# Patient Record
Sex: Female | Born: 1990 | Race: White | Hispanic: No | Marital: Single | Smoking: Former smoker
Health system: Southern US, Community
[De-identification: ages and names within clinical notes are randomized; demographics above are authoritative.]

## PROBLEM LIST (undated history)

## (undated) DIAGNOSIS — G40909 Epilepsy, unspecified, not intractable, without status epilepticus: Secondary | ICD-10-CM

## (undated) HISTORY — PX: NO PAST SURGERIES: SHX2092

---

## 2016-11-27 ENCOUNTER — Emergency Department
Admission: EM | Admit: 2016-11-27 | Discharge: 2016-11-27 | Disposition: A | Discharge status: Home / Self Care | Payer: Medicaid - Out of State | Attending: Emergency Medicine | Admitting: Emergency Medicine

## 2016-11-27 ENCOUNTER — Encounter: Payer: Self-pay | Admitting: Emergency Medicine

## 2016-11-27 DIAGNOSIS — F17210 Nicotine dependence, cigarettes, uncomplicated: Secondary | ICD-10-CM | POA: Insufficient documentation

## 2016-11-27 DIAGNOSIS — J0100 Acute maxillary sinusitis, unspecified: Secondary | ICD-10-CM | POA: Diagnosis not present

## 2016-11-27 DIAGNOSIS — J069 Acute upper respiratory infection, unspecified: Secondary | ICD-10-CM | POA: Diagnosis not present

## 2016-11-27 DIAGNOSIS — R0981 Nasal congestion: Secondary | ICD-10-CM | POA: Diagnosis present

## 2016-11-27 MED ORDER — FLUTICASONE PROPIONATE 50 MCG/ACT NA SUSP: 2.0000 | Freq: Every day | NASAL | 0 refills | Status: AC

## 2016-11-27 MED ORDER — PREDNISONE 10 MG PO TABS: ORAL_TABLET | ORAL | 0 refills | Status: DC

## 2016-11-27 MED ORDER — AMOXICILLIN-POT CLAVULANATE 875-125 MG PO TABS: 1.0000 | ORAL_TABLET | Freq: Two times a day (BID) | ORAL | 0 refills | Status: AC

## 2016-11-27 NOTE — ED Notes (Signed)
Pt alert and oriented X4, active, cooperative, pt in NAD. RR even and unlabored, color WNL.  Pt informed to return if any life threatening symptoms occur.   

## 2016-11-27 NOTE — Discharge Instructions (Signed)
[facility] if any continued problems. Begin taking Augmentin 875 twice a day for 10 days, Flonase nasal spray as directed and prednisone 3 tablets once a day for the next 5 days. You may also take Tylenol if needed for headache. Increase fluids. Decrease or discontinue smoking.

## 2016-11-27 NOTE — ED Notes (Signed)
Patient states she has had this "cold" for the last two weeks and it has been getting worse. None productive cough most of the time, unable to sleep at night d/t the congestion.

## 2016-11-27 NOTE — ED Provider Notes (Signed)
[facility] Emergency Department Provider Note  ____________________________________________   First MD Initiated Contact with Patient 11/27/16 1236     (approximate)  I have reviewed the triage vital signs and the nursing notes.   HISTORY  Chief Complaint Nasal Congestion   HPI Jaime Ibarra is a 26 y.o. female is here with complaint of cough and congestion for the last 2 weeks. Patient states that she thought she was getting better but now has developed bilateral ear pain and a productive cough. Patient is been taking over-the-counter medication with minimal relief. Currently she rates her pain as 5/10.  History reviewed. No pertinent past medical history.  There are no active problems to display for this patient.   History reviewed. No pertinent surgical history.  Prior to Admission medications   Medication Sig Start Date End Date Taking? Authorizing Provider  amoxicillin-clavulanate (AUGMENTIN) 875-125 MG tablet Take 1 tablet by mouth 2 (two) times daily. 11/27/16 12/04/16  Tommi RumpsSummers, Andromeda Poppen L, PA-C  fluticasone (FLONASE) 50 MCG/ACT nasal spray Place 2 sprays into both nostrils daily. 11/27/16 11/27/17  Tommi RumpsSummers, Onaje Warne L, PA-C  predniSONE (DELTASONE) 10 MG tablet Take 3 tablets once a day for 5 days 11/27/16   Tommi RumpsSummers, Lateka Rady L, PA-C    Allergies Patient has no known allergies.  No family history on file.  Social History Social History  Substance Use Topics  . Smoking status: Current Every Day Smoker    Packs/day: 0.25    Types: Cigarettes  . Smokeless tobacco: Never Used  . Alcohol use No    Review of Systems  Constitutional: No fever/chills Eyes: No visual changes. ENT: No sore throat. Positive bilateral ear pain. Positive maxillary sinus pain. Cardiovascular: Denies chest pain. Respiratory: Denies shortness of breath. Gastrointestinal: No abdominal pain.  No nausea, no vomiting.   Musculoskeletal: Negative for back pain. Skin:  Negative for rash. Neurological: Negative for headaches, focal weakness or numbness. ____________________________________________   PHYSICAL EXAM:  VITAL SIGNS: ED Triage Vitals  Enc Vitals Group     BP 11/27/16 1140 120/69     Pulse Rate 11/27/16 1140 (!) 111     Resp 11/27/16 1140 18     Temp 11/27/16 1140 97.6 F (36.4 C)     Temp Source 11/27/16 1140 Oral     SpO2 11/27/16 1140 97 %     Weight 11/27/16 1137 120 lb (54.4 kg)     Height 11/27/16 1137 5' (1.524 m)     Head Circumference --      Peak Flow --      Pain Score 11/27/16 1137 5     Pain Loc --      Pain Edu? --      Excl. in GC? --    Constitutional: Alert and oriented. Well appearing and in no acute distress. Eyes: Conjunctivae are normal.  Head: Atraumatic. Nose: mild congestion/rhinnorhea.  EACs are clear bilaterally. TMs are dull with minimal effusion present. Maxillary sinuses tender to percussion bilaterally. Mouth/Throat: Mucous membranes are moist.  Oropharynx non-erythematous. Neck: No stridor.   Hematological/Lymphatic/Immunilogical: No cervical lymphadenopathy. Cardiovascular: Normal rate, regular rhythm. Grossly normal heart sounds.  Good peripheral circulation. Respiratory: Normal respiratory effort.  No retractions. Lungs CTAB. Gastrointestinal: Soft and nontender.  Musculoskeletal: [place] and lower extremities without any difficulty. Neurologic:  Normal speech and language. No gross focal neurologic deficits are appreciated. No gait instability. Skin:  Skin is warm, dry and intact. No rash noted. Psychiatric: Mood and affect are normal. Speech and behavior  are normal.  ____________________________________________   LABS (all labs ordered are listed, but only abnormal results are displayed)  Labs Reviewed - No data to display   PROCEDURES  Procedure(s) performed: None  Procedures  Critical Care performed: No  ____________________________________________   INITIAL IMPRESSION /  ASSESSMENT AND PLAN / ED COURSE   Patient was discharged on  Augmentin 875 twice a day for 10 days along with prednisone and Flonase nasal spray. Patient is to follow-up with [facility]  clinic if any continued problems. She is also encouraged to discontinue smoking. ____________________________________________   FINAL CLINICAL IMPRESSION(S) / ED DIAGNOSES  Final diagnoses:  Acute maxillary sinusitis, recurrence not specified  Upper respiratory tract infection, unspecified type      NEW MEDICATIONS STARTED DURING THIS VISIT:  Discharge Medication List as of 11/27/2016 12:56 PM    START taking these medications   Details  amoxicillin-clavulanate (AUGMENTIN) 875-125 MG tablet Take 1 tablet by mouth 2 (two) times daily., Starting Thu 11/27/2016, Until Thu 12/04/2016, Print    fluticasone (FLONASE) 50 MCG/ACT nasal spray Place 2 sprays into both nostrils daily., Starting Thu 11/27/2016, Until Fri 11/27/2017, Print    predniSONE (DELTASONE) 10 MG tablet Take 3 tablets once a day for 5 days, Print         Note:  This document was prepared using Dragon voice recognition software and may include unintentional dictation errors.    Tommi Rumps, PA-C 11/27/16 1600    Jeanmarie Plant, MD 11/29/16 [phone]

## 2016-11-27 NOTE — ED Triage Notes (Signed)
Patient presents to the ED with congestion x 2 weeks with bilateral ear pain and cough.  Patient is in no obvious distress at this time.  Patient states, "I thought it was getting better, but the past two days, I've felt worse."

## 2017-04-13 ENCOUNTER — Emergency Department: Payer: Medicaid - Out of State

## 2017-04-13 ENCOUNTER — Other Ambulatory Visit: Payer: Self-pay

## 2017-04-13 ENCOUNTER — Encounter: Payer: Self-pay | Admitting: Emergency Medicine

## 2017-04-13 ENCOUNTER — Emergency Department
Admission: EM | Admit: 2017-04-13 | Discharge: 2017-04-13 | Disposition: A | Discharge status: Home / Self Care | Payer: Medicaid - Out of State | Attending: Emergency Medicine | Admitting: Emergency Medicine

## 2017-04-13 DIAGNOSIS — J40 Bronchitis, not specified as acute or chronic: Secondary | ICD-10-CM | POA: Insufficient documentation

## 2017-04-13 DIAGNOSIS — F17210 Nicotine dependence, cigarettes, uncomplicated: Secondary | ICD-10-CM | POA: Insufficient documentation

## 2017-04-13 MED ORDER — ALBUTEROL SULFATE HFA 108 (90 BASE) MCG/ACT IN AERS: 2.0000 | INHALATION_SPRAY | Freq: Four times a day (QID) | RESPIRATORY_TRACT | 2 refills | Status: DC | PRN

## 2017-04-13 MED ORDER — CETIRIZINE HCL 10 MG PO CAPS: 1.0000 | ORAL_CAPSULE | Freq: Every day | ORAL | 1 refills | Status: DC

## 2017-04-13 NOTE — ED Notes (Signed)
See triage note  Presents with "bad" cough  States she coughs so hard that she can't breath  Also has had some occasional wheezing  No fever

## 2017-04-13 NOTE — ED Triage Notes (Signed)
States every couple days gets a severe coughing spell and this has been happening since January.

## 2017-04-13 NOTE — ED Provider Notes (Signed)
[facility] Emergency Department Provider Note  ____________________________________________  Time seen: Approximately 5:16 PM  I have reviewed the triage vital signs and the nursing notes.   HISTORY  Chief Complaint Cough    HPI Jaime Ibarra is a 27 y.o. female presents to the emergency department with nonproductive cough that occurs intermittently every couple of days.  Patient reports that when she experiences a coughing spell, her throat becomes pruritic and she becomes short of breath.  Patient smokes approximately 6 cigarettes/day.  Cough is not productive for purulent sputum production and is not worse at night.  Patient only has one coughing episode at a time.  She denies chest tightness and chest pain.  She smokes cigarettes and recreational marijuana.  No alleviating measures have been attempted.   History reviewed. No pertinent past medical history.  There are no active problems to display for this patient.   History reviewed. No pertinent surgical history.  Prior to Admission medications   Medication Sig Start Date End Date Taking? Authorizing Provider  albuterol (PROVENTIL HFA;VENTOLIN HFA) 108 (90 Base) MCG/ACT inhaler Inhale 2 puffs into the lungs every 6 (six) hours as needed for wheezing or shortness of breath. 04/13/17   Orvil Feil, PA-C  Cetirizine HCl (ZYRTEC ALLERGY) 10 MG CAPS Take 1 capsule (10 mg total) by mouth daily. 04/13/17   Orvil Feil, PA-C  fluticasone (FLONASE) 50 MCG/ACT nasal spray Place 2 sprays into both nostrils daily. 11/27/16 11/27/17  Tommi Rumps, PA-C  predniSONE (DELTASONE) 10 MG tablet Take 3 tablets once a day for 5 days 11/27/16   Tommi Rumps, PA-C    Allergies Patient has no known allergies.  No family history on file.  Social History Social History   Tobacco Use  . Smoking status: Current Every Day Smoker    Packs/day: 0.25    Types: Cigarettes  . Smokeless tobacco: Never Used   Substance Use Topics  . Alcohol use: No  . Drug use: Not on file     Review of Systems  Constitutional: No fever/chills Eyes: No visual changes. No discharge ENT: No upper respiratory complaints. Cardiovascular: no chest pain. Respiratory: Patient has nonproductive cough and intermittent shortness of breath. Gastrointestinal: No abdominal pain.  No nausea, no vomiting.  No diarrhea.  No constipation. Musculoskeletal: Negative for musculoskeletal pain. Skin: Negative for rash, abrasions, lacerations, ecchymosis. Neurological: Negative for headaches, focal weakness or numbness.   ____________________________________________   PHYSICAL EXAM:  VITAL SIGNS: ED Triage Vitals [04/13/17 1624]  Enc Vitals Group     BP 126/82     Pulse Rate 97     Resp 18     Temp 98.6 F (37 C)     Temp Source Oral     SpO2 97 %     Weight 124 lb (56.2 kg)     Height 5' (1.524 m)     Head Circumference      Peak Flow      Pain Score      Pain Loc      Pain Edu?      Excl. in GC?      Constitutional: Alert and oriented. Well appearing and in no acute distress. Eyes: Conjunctivae are normal. PERRL. EOMI. Head: Atraumatic. ENT:      Ears: TMs are pearly.      Nose: No congestion/rhinnorhea.      Mouth/Throat: Mucous membranes are moist.  Patient has residual left tonsil  space. Hematological/Lymphatic/Immunilogical: No cervical lymphadenopathy.  Cardiovascular: Normal rate, regular rhythm. Normal S1 and S2.  Good peripheral circulation. Respiratory: Normal respiratory effort without tachypnea or retractions. Lungs CTAB. Good air entry to the bases with no decreased or absent breath sounds. Musculoskeletal: Full range of motion to all extremities. No gross deformities appreciated. Neurologic:  Normal speech and language. No gross focal neurologic deficits are appreciated.  Skin:  Skin is warm, dry and intact. No rash noted. Psychiatric: Mood and affect are normal. Speech and behavior are  normal. Patient exhibits appropriate insight and judgement.   ____________________________________________   LABS (all labs ordered are listed, but only abnormal results are displayed)  Labs Reviewed - No data to display ____________________________________________  EKG   ____________________________________________  RADIOLOGY Geraldo PitterI, Mariadel Mruk M Dvon Jiles, personally viewed and evaluated these images (plain radiographs) as part of my medical decision making, as well as reviewing the written report by the radiologist.  Dg Chest 2 View  Result Date: 04/13/2017 CLINICAL DATA:  Cough and shortness of Breath EXAM: CHEST - 2 VIEW COMPARISON:  None. FINDINGS: The heart size and mediastinal contours are within normal limits. Both lungs are clear. The visualized skeletal structures are unremarkable. IMPRESSION: No active cardiopulmonary disease. Electronically Signed   By: Alcide CleverMark  Lukens M.D.   On: 04/13/2017 16:57    ____________________________________________    PROCEDURES  Procedure(s) performed:    Procedures    Medications - No data to display   ____________________________________________   INITIAL IMPRESSION / ASSESSMENT AND PLAN / ED COURSE  Pertinent labs & imaging results that were available during my care of the patient were reviewed by me and considered in my medical decision making (see chart for details).  Review of the [place] CSRS was performed in accordance of the [facility] prior to dispensing any controlled drugs.     Assessment and plan Bronchitis Patient presents to the emergency department with episodes of coughing spells that are associated with cigarette smoking.  Differential diagnosis included community-acquired pneumonia, smoking induced bronchospasm and acute bronchitis.  Patient was treated empirically with Zyrtec and given an albuterol inhaler.  Patient education regarding smoking cessation was emphasized during 3 aspects of this emergency department encounter.   Patient was advised to follow-up with primary care as needed.  All patient questions were answered.     ____________________________________________  FINAL CLINICAL IMPRESSION(S) / ED DIAGNOSES  Final diagnoses:  Bronchitis      NEW MEDICATIONS STARTED DURING THIS VISIT:  ED Discharge Orders        Ordered    Cetirizine HCl (ZYRTEC ALLERGY) 10 MG CAPS  Daily     04/13/17 1711    albuterol (PROVENTIL HFA;VENTOLIN HFA) 108 (90 Base) MCG/ACT inhaler  Every 6 hours PRN     04/13/17 1711          This chart was dictated using voice recognition software/Dragon. Despite best efforts to proofread, errors can occur which can change the meaning. Any change was purely unintentional.    Orvil FeilWoods, Theta Leaf M, PA-C 04/13/17 1720    Merrily Brittleifenbark, Neil, MD 04/13/17 1744

## 2018-08-28 ENCOUNTER — Observation Stay
Admission: EM | Admit: 2018-08-28 | Discharge: 2018-08-28 | Disposition: A | Discharge status: Home / Self Care | Payer: BC Managed Care – PPO | Attending: Obstetrics & Gynecology | Admitting: Obstetrics & Gynecology

## 2018-08-28 ENCOUNTER — Other Ambulatory Visit: Payer: Self-pay

## 2018-08-28 ENCOUNTER — Encounter: Payer: Self-pay | Admitting: *Deleted

## 2018-08-28 DIAGNOSIS — Z3A24 24 weeks gestation of pregnancy: Secondary | ICD-10-CM | POA: Insufficient documentation

## 2018-08-28 DIAGNOSIS — O26892 Other specified pregnancy related conditions, second trimester: Principal | ICD-10-CM | POA: Insufficient documentation

## 2018-08-28 DIAGNOSIS — Z79899 Other long term (current) drug therapy: Secondary | ICD-10-CM | POA: Diagnosis not present

## 2018-08-28 DIAGNOSIS — M545 Low back pain: Secondary | ICD-10-CM

## 2018-08-28 LAB — URINALYSIS, ROUTINE W REFLEX MICROSCOPIC
Bilirubin Urine: NEGATIVE
Glucose, UA: NEGATIVE mg/dL
Hgb urine dipstick: NEGATIVE
Ketones, ur: NEGATIVE mg/dL
Leukocytes,Ua: NEGATIVE
Nitrite: NEGATIVE
Protein, ur: NEGATIVE mg/dL
Specific Gravity, Urine: 1.013 (ref 1.005–1.030)
pH: 6 (ref 5.0–8.0)

## 2018-08-28 NOTE — Discharge Instructions (Signed)

## 2018-08-28 NOTE — Discharge Summary (Signed)
  See FPN 

## 2018-08-28 NOTE — Progress Notes (Signed)
[facility] [place] [id number] [place] [address] Phone: [phone] Fax: [phone]  August 28, 2018  Patient: Jaime Ibarra  Date of Birth: [birth year]  Date of Visit: 08/28/2018    To Whom It May Concern:  Ahley Bulls was seen and treated in our Labor and [facility] on 08/28/2018. Keiasha Diep  may return to work on 08/29/2018.  Sincerely,  Barnett Applebaum, MD [facility]

## 2018-08-28 NOTE — OB Triage Note (Signed)
Presents with complaint of back pain that "is pretty much all over my back... it comes and goes. Denies any bleeding or leaking of fluid. States urinary frequency, denies burning with urination. States she took 2 tylenol this morning at 8am and they did not help.  Pt receives her prenatal care at [facility].

## 2018-08-28 NOTE — Final Progress Note (Signed)
Physician Final Progress Note  Patient ID: Jaime Ibarra MRN: [id number] DOB/AGE: 03-16-1990 28 y.o.  Admit date: 08/28/2018 Admitting provider: Nadara Mustardobert P Matricia Begnaud, MD Discharge date: 08/28/2018  Admission Diagnoses: Low back pain pregnancy 24 weeks pregnancy  Discharge Diagnoses:   same  Consults: None  Significant Findings/ Diagnostic Studies:  Obstetrics Admission History & Physical   No chief complaint on file.   HPI:  28 y.o. G1P0 @ 4582w0d (12/18/2018, by Patient Reported). Admitted on 08/28/2018:   Patient Active Problem List   Diagnosis Date Noted  . Indication for care in labor or delivery 08/28/2018    Presents for 1 day h/o low back pain that radiates to bilateral groin that is moderate and intermitent and not associated with other findings; tried heat and Tylenol w min relief..   Prenatal care at: at another place. Pregnancy complicated by none.  ROS: A review of systems was performed and negative, except as stated in the above HPI. PMHx: No past medical history on file. PSHx: No past surgical history on file. Medications:  Medications Prior to Admission  Medication Sig Dispense Refill Last Dose  . albuterol (PROVENTIL HFA;VENTOLIN HFA) 108 (90 Base) MCG/ACT inhaler Inhale 2 puffs into the lungs every 6 (six) hours as needed for wheezing or shortness of breath. 1 Inhaler 2 Past Week at Unknown time  . Prenatal Vit-Fe Fumarate-FA (PRENATAL MULTIVITAMIN) TABS tablet Take 1 tablet by mouth.   08/27/2018 at Unknown time  . Cetirizine HCl (ZYRTEC ALLERGY) 10 MG CAPS Take 1 capsule (10 mg total) by mouth daily. (Patient not taking: Reported on 08/28/2018) 30 capsule 1 Not Taking at Unknown time  . fluticasone (FLONASE) 50 MCG/ACT nasal spray Place 2 sprays into both nostrils daily. 16 g 0   . predniSONE (DELTASONE) 10 MG tablet Take 3 tablets once a day for 5 days (Patient not taking: Reported on 08/28/2018) 15 tablet 0 Completed Course at Unknown time   Allergies: has No  Known Allergies. OBHx:  OB History  Gravida Para Term Preterm AB Living  1            SAB TAB Ectopic Multiple Live Births               # Outcome Date GA Lbr Len/2nd Weight Sex Delivery Anes PTL Lv  1 Current            NWG:NFAOZHYQ/MVHQIONGEXBMFHx:Negative/unremarkable except as detailed in HPI.Marland Kitchen.  No family history of birth defects. Soc Hx: Alcohol: none and Recreational drug use: none  Objective:   Vitals:   08/28/18 1238  Resp: 18  Temp: 98.4 F (36.9 C)   Constitutional: Well nourished, well developed female in no acute distress.  HEENT: normal Skin: Warm and dry.  Cardiovascular:Regular rate and rhythm.   Extremity: trace to 1+ bilateral pedal edema Respiratory: Clear to auscultation bilateral. Normal respiratory effort Abdomen: gravid, ND, FHT present, NT, without guarding, without rebound tenderness on exam Back: no CVAT Neuro: DTRs 2+, Cranial nerves grossly intact Psych: Alert and Oriented x3. No memory deficits. Normal mood and affect.  MS: normal gait, normal bilateral lower extremity ROM/strength/stability.  Procedures: FHT reassuring for 24 weeks Toco- no ctxs UA neg Results for orders placed or performed during the hospital encounter of 08/28/18  Urinalysis, Routine w reflex microscopic  Result Value Ref Range   Color, Urine YELLOW (A) YELLOW   APPearance HAZY (A) CLEAR   Specific Gravity, Urine 1.013 1.005 - 1.030   pH 6.0 5.0 - 8.0   Glucose,  UA NEGATIVE NEGATIVE mg/dL   Hgb urine dipstick NEGATIVE NEGATIVE   Bilirubin Urine NEGATIVE NEGATIVE   Ketones, ur NEGATIVE NEGATIVE mg/dL   Protein, ur NEGATIVE NEGATIVE mg/dL   Nitrite NEGATIVE NEGATIVE   Leukocytes,Ua NEGATIVE NEGATIVE    Discharge Condition: good  Disposition: Discharge disposition: 01-Home or Self Care       Diet: Regular diet  Discharge Activity: Activity as tolerated  Discharge Instructions    Call MD for:   Complete by: As directed    Worsening contractions or pain; leakage of fluid;  bleeding.   Diet general   Complete by: As directed    Increase activity slowly   Complete by: As directed      Allergies as of 08/28/2018   No Known Allergies     Medication List    TAKE these medications   albuterol 108 (90 Base) MCG/ACT inhaler Commonly known as: VENTOLIN HFA Inhale 2 puffs into the lungs every 6 (six) hours as needed for wheezing or shortness of breath.   Cetirizine HCl 10 MG Caps Commonly known as: ZyrTEC Allergy Take 1 capsule (10 mg total) by mouth daily.   fluticasone 50 MCG/ACT nasal spray Commonly known as: Flonase Place 2 sprays into both nostrils daily.   predniSONE 10 MG tablet Commonly known as: DELTASONE Take 3 tablets once a day for 5 days   prenatal multivitamin Tabs tablet Take 1 tablet by mouth.        Total time spent taking care of this patient: 15 minutes  Signed: Hoyt Koch 08/28/2018, 1:53 PM

## 2018-09-14 ENCOUNTER — Other Ambulatory Visit: Payer: Self-pay

## 2018-09-14 ENCOUNTER — Ambulatory Visit
Admission: EM | Admit: 2018-09-14 | Discharge: 2018-09-14 | Disposition: A | Discharge status: Home / Self Care | Payer: BC Managed Care – PPO | Attending: Family Medicine | Admitting: Family Medicine

## 2018-09-14 DIAGNOSIS — N760 Acute vaginitis: Secondary | ICD-10-CM

## 2018-09-14 DIAGNOSIS — B9689 Other specified bacterial agents as the cause of diseases classified elsewhere: Secondary | ICD-10-CM | POA: Diagnosis not present

## 2018-09-14 LAB — WET PREP, GENITAL
Sperm: NONE SEEN
Trich, Wet Prep: NONE SEEN
Yeast Wet Prep HPF POC: NONE SEEN

## 2018-09-14 MED ORDER — METRONIDAZOLE 0.75 % VA GEL: 1.0000 | Freq: Every day | VAGINAL | 0 refills | Status: AC

## 2018-09-14 NOTE — ED Provider Notes (Signed)
[facility]    CSN: [id number] Arrival date & time: 09/14/18  1630   History   Chief Complaint Chief Complaint  Patient presents with  . Vaginal Discharge   HPI  28 year old female presents with vaginal discharge. She is currently [redacted] weeks pregnant.  Patient reports that she has recently developed an increase in vaginal discharge from her baseline.  She has noticed a mild odor.  No itching.  No vaginal bleeding.  She reports good fetal movement.  She is otherwise feeling well.  She is concerned that she may have bacterial vaginosis.  No medications or interventions tried.  No other reported symptoms.  No other complaints.  History reviewed as below.  PMH: Hx of BV  Patient Active Problem List   Diagnosis Date Noted  . Indication for care in labor or delivery 08/28/2018   Past Surgical History:  Procedure Laterality Date  . NO PAST SURGERIES      OB History    Gravida  1   Para      Term      Preterm      AB      Living        SAB      TAB      Ectopic      Multiple      Live Births               Home Medications    Prior to Admission medications   Medication Sig Start Date End Date Taking? Authorizing Provider  albuterol (PROVENTIL HFA;VENTOLIN HFA) 108 (90 Base) MCG/ACT inhaler Inhale 2 puffs into the lungs every 6 (six) hours as needed for wheezing or shortness of breath. 04/13/17  Yes Orvil FeilWoods, Jaclyn M, PA-C  Prenatal Vit-Fe Fumarate-FA (PRENATAL MULTIVITAMIN) TABS tablet Take 1 tablet by mouth.   Yes [provider]  fluticasone (FLONASE) 50 MCG/ACT nasal spray Place 2 sprays into both nostrils daily. 11/27/16 11/27/17  Tommi RumpsSummers, Rhonda L, PA-C  metroNIDAZOLE (METROGEL VAGINAL) 0.75 % vaginal gel Place 1 Applicatorful vaginally at bedtime for 5 days. 09/14/18 09/19/18  Tommie Samsook, Zymier Rodgers G, DO  Cetirizine HCl (ZYRTEC ALLERGY) 10 MG CAPS Take 1 capsule (10 mg total) by mouth daily. Patient not taking: Reported on 08/28/2018 04/13/17  09/14/18  Orvil FeilWoods, Jaclyn M, PA-C   Social History Social History   Tobacco Use  . Smoking status: Former Smoker    Packs/day: 0.25    Types: Cigarettes  . Smokeless tobacco: Never Used  Substance Use Topics  . Alcohol use: No  . Drug use: Never     Allergies   Patient has no known allergies.   Review of Systems Review of Systems  Constitutional: Negative.   Genitourinary: Positive for vaginal discharge.   Physical Exam Triage Vital Signs ED Triage Vitals  Enc Vitals Group     BP 09/14/18 1644 (!) 110/57     Pulse Rate 09/14/18 1644 85     Resp 09/14/18 1644 18     Temp 09/14/18 1644 98.1 F (36.7 C)     Temp Source 09/14/18 1644 Oral     SpO2 09/14/18 1644 100 %     Weight 09/14/18 1640 143 lb 4.8 oz (65 kg)     Height 09/14/18 1640 5' (1.524 m)     Head Circumference --      Peak Flow --      Pain Score 09/14/18 1640 0     Pain Loc --  Pain Edu? --      Excl. in [place]? --    Updated Vital Signs BP (!) 110/57 (BP Location: Left Arm)   Pulse 85   Temp 98.1 F (36.7 C) (Oral)   Resp 18   Ht 5' (1.524 m)   Wt 65 kg   SpO2 100%   BMI 27.99 kg/m   Visual Acuity Right Eye Distance:   Left Eye Distance:   Bilateral Distance:    Right Eye Near:   Left Eye Near:    Bilateral Near:     Physical Exam Vitals signs and nursing note reviewed.  Constitutional:      General: She is not in acute distress.    Appearance: Normal appearance.  HENT:     Head: Normocephalic and atraumatic.  Eyes:     General:        Right eye: No discharge.        Left eye: No discharge.     Conjunctiva/sclera: Conjunctivae normal.  Cardiovascular:     Rate and Rhythm: Normal rate and regular rhythm.  Pulmonary:     Effort: Pulmonary effort is normal.     Breath sounds: Normal breath sounds.  Neurological:     Mental Status: She is alert.  Psychiatric:        Mood and Affect: Mood normal.        Behavior: Behavior normal.    UC Treatments / Results  Labs (all labs  ordered are listed, but only abnormal results are displayed) Labs Reviewed  WET PREP, GENITAL - Abnormal; Notable for the following components:      Result Value   Clue Cells Wet Prep HPF POC PRESENT (*)    WBC, Wet Prep HPF POC FEW (*)    All other components within normal limits    EKG   Radiology No results found.  Procedures Procedures (including critical care time)  Medications Ordered in UC Medications - No data to display  Initial Impression / Assessment and Plan / UC Course  I have reviewed the triage vital signs and the nursing notes.  Pertinent labs & imaging results that were available during my care of the patient were reviewed by me and considered in my medical decision making (see chart for details).    28 year old female presents with vaginal discharge.  Wet prep with clue cells.  Treating with Metrogel.  Final Clinical Impressions(s) / UC Diagnoses   Final diagnoses:  Bacterial vaginosis   Discharge Instructions   None    ED Prescriptions    Medication Sig Dispense Auth. Provider   metroNIDAZOLE (METROGEL VAGINAL) 0.75 % vaginal gel Place 1 Applicatorful vaginally at bedtime for 5 days. 70 g Coral Spikes, DO     Controlled Substance Prescriptions [place] Controlled Substance Registry consulted? Not Applicable   Coral Spikes, DO 09/14/18 1717

## 2018-09-14 NOTE — ED Triage Notes (Signed)
Patient complains of increased vaginal discharge with odor. Denies any blood.

## 2019-08-17 ENCOUNTER — Ambulatory Visit
Admission: EM | Admit: 2019-08-17 | Discharge: 2019-08-17 | Disposition: A | Discharge status: Home / Self Care | Payer: Medicaid Other | Attending: Family Medicine | Admitting: Family Medicine

## 2019-08-17 ENCOUNTER — Other Ambulatory Visit: Payer: Self-pay

## 2019-08-17 ENCOUNTER — Encounter: Payer: Self-pay | Admitting: Emergency Medicine

## 2019-08-17 DIAGNOSIS — B370 Candidal stomatitis: Secondary | ICD-10-CM

## 2019-08-17 MED ORDER — NYSTATIN 100000 UNIT/ML MT SUSP: 500000.0000 [IU] | Freq: Four times a day (QID) | OROMUCOSAL | 0 refills | Status: DC

## 2019-08-17 MED ORDER — FLUCONAZOLE 100 MG PO TABS: 100.0000 mg | ORAL_TABLET | Freq: Every day | ORAL | 0 refills | Status: DC

## 2019-08-17 NOTE — ED Provider Notes (Signed)
[facility] ____________________________________________  Time seen: Approximately 10:50 AM  I have reviewed the triage vital signs and the nursing notes.   HISTORY  Chief Complaint Thrush   HPI Jaime Ibarra is a 29 y.o. female presenting for evaluation of raw sore and whitish coloration underneath her tongue present gradual onset for the last 1 week.  Also reports in the last 2 days starting to notice some slight nipple irritation, but no rash, redness, swelling or other skin changes.  Patient is breast-feeding her 62-month-old.  Patient states she had similar when child was 39 weeks old and she had a thrush infection resolved well with oral Diflucan.  States overall 47-month-old seems to be eating well but does not stay latched as long as normal.  States she will be reaching out to child's pediatrician for same complaints for child.  Patient does have her albuterol inhaler that she occasionally uses but has not been using recently.  Denies any other changes.  Denies recent sickness, fevers or other complaints.  Denies aggravating or alleviating factors.  Denies current pregnancy.    History reviewed. No pertinent past medical history.  Patient Active Problem List   Diagnosis Date Noted  . Indication for care in labor or delivery 08/28/2018    Past Surgical History:  Procedure Laterality Date  . NO PAST SURGERIES       No current facility-administered medications for this encounter.  Current Outpatient Medications:  .  albuterol (PROVENTIL HFA;VENTOLIN HFA) 108 (90 Base) MCG/ACT inhaler, Inhale 2 puffs into the lungs every 6 (six) hours as needed for wheezing or shortness of breath., Disp: 1 Inhaler, Rfl: 2 .  fluconazole (DIFLUCAN) 100 MG tablet, Take 1 tablet (100 mg total) by mouth daily. 200 mg once, then 100 mg orally daily, Disp: 8 tablet, Rfl: 0 .  fluticasone (FLONASE) 50 MCG/ACT nasal spray, Place 2 sprays into both nostrils daily., Disp: 16 g, Rfl: 0 .   nystatin (MYCOSTATIN) 100000 UNIT/ML suspension, Take 5 mLs (500,000 Units total) by mouth 4 (four) times daily. Swish for several minutes then swallow, Disp: 100 mL, Rfl: 0 .  Prenatal Vit-Fe Fumarate-FA (PRENATAL MULTIVITAMIN) TABS tablet, Take 1 tablet by mouth., Disp: , Rfl:   Allergies Patient has no known allergies.  History reviewed. No pertinent family history.  Social History Social History   Tobacco Use  . Smoking status: Former Smoker    Packs/day: 0.25    Types: Cigarettes  . Smokeless tobacco: Never Used  Vaping Use  . Vaping Use: Never used  Substance Use Topics  . Alcohol use: No  . Drug use: Never    Review of Systems Constitutional: No fever ENT: No sore throat. As above. Cardiovascular: Denies chest pain. Respiratory: Denies shortness of breath. Gastrointestinal: No abdominal pain.  Skin: Negative for rash.   ____________________________________________   PHYSICAL EXAM:  VITAL SIGNS: ED Triage Vitals  Enc Vitals Group     BP 08/17/19 0835 109/70     Pulse Rate 08/17/19 0835 85     Resp 08/17/19 0835 18     Temp 08/17/19 0835 98.5 F (36.9 C)     Temp Source 08/17/19 0835 Oral     SpO2 08/17/19 0835 100 %     Weight 08/17/19 0834 143 lb 4.8 oz (65 kg)     Height 08/17/19 0834 5' (1.524 m)     Head Circumference --      Peak Flow --      Pain Score 08/17/19 0834 3  Pain Loc --      Pain Edu? --      Excl. in GC? --     Constitutional: Alert and oriented. Well appearing and in no acute distress. Eyes: Conjunctivae are normal.  ENT      Head: Normocephalic and atraumatic.      Nose: No congestion/rhinnorhea.      Mouth/Throat: Mucous membranes are moist.Oropharynx non-erythematous.  Whitish plaque to volar aspect of tongue with mild tenderness, no erythema, no induration or edema, no other tongue or oral mucosal changes noted. Neck: No stridor. Supple without meningismus.  Hematological/Lymphatic/Immunilogical: No cervical  lymphadenopathy. Cardiovascular: Good peripheral circulation. Respiratory: Normal respiratory effort without tachypnea nor retractions.  Musculoskeletal:Steady gait.  Neurologic:  Normal speech and language.  Skin:  Skin is warm, dry. Psychiatric: Mood and affect are normal. Speech and behavior are normal. Patient exhibits appropriate insight and judgment.   ___________________________________________   LABS (all labs ordered are listed, but only abnormal results are displayed)  Labs Reviewed - No data to display ____________________________________________   PROCEDURES Procedures    INITIAL IMPRESSION / ASSESSMENT AND PLAN / ED COURSE  Pertinent labs & imaging results that were available during my care of the patient were reviewed by me and considered in my medical decision making (see chart for details).  Well-appearing patient.  Appearance of oral thrush.  As patient does report starting to develop some nipple irritation and she is breast-feeding 28-month-old, will treat with oral Diflucan as well as thrush swish.  Treat nipples post feeds.  Monitor.  Follow-up with pediatrician for child.Discussed indication, risks and benefits of medications with patient.   Discussed follow up with Primary care physician this week. Discussed follow up and return parameters including no resolution or any worsening concerns. Patient verbalized understanding and agreed to plan.   ____________________________________________   FINAL CLINICAL IMPRESSION(S) / ED DIAGNOSES  Final diagnoses:  Thrush     ED Discharge Orders         Ordered    nystatin (MYCOSTATIN) 100000 UNIT/ML suspension  4 times daily     Discontinue  Reprint     08/17/19 0901    fluconazole (DIFLUCAN) 100 MG tablet  Daily     Discontinue  Reprint     08/17/19 0901           Note: This dictation was prepared with Dragon dictation along with smaller phrase technology. Any transcriptional errors that result from this  process are unintentional.         Renford Dills, NP 08/17/19 1058

## 2019-08-17 NOTE — Discharge Instructions (Addendum)
Take medication as prescribed. Rest. Drink plenty of fluids.  Follow-up with your child's pediatrician as discussed.  Nipples need to be treated after feeding as well, as discussed.  Follow up with your primary care physician this week as needed. Return to Urgent care for new or worsening concerns.

## 2019-08-17 NOTE — ED Triage Notes (Signed)
Patient c/o possible thrush under her tongue that started 1 week ago.

## 2019-09-06 ENCOUNTER — Encounter: Payer: Self-pay | Admitting: Emergency Medicine

## 2019-09-06 ENCOUNTER — Other Ambulatory Visit: Payer: Self-pay

## 2019-09-06 ENCOUNTER — Ambulatory Visit
Admission: EM | Admit: 2019-09-06 | Discharge: 2019-09-06 | Disposition: A | Discharge status: Home / Self Care | Payer: Medicaid Other | Attending: Emergency Medicine | Admitting: Emergency Medicine

## 2019-09-06 DIAGNOSIS — B370 Candidal stomatitis: Secondary | ICD-10-CM | POA: Diagnosis not present

## 2019-09-06 MED ORDER — CLOTRIMAZOLE 10 MG MT TROC: 10.0000 mg | Freq: Every day | OROMUCOSAL | 0 refills | Status: AC

## 2019-09-06 MED ORDER — FLUCONAZOLE 200 MG PO TABS: 200.0000 mg | ORAL_TABLET | Freq: Every day | ORAL | 0 refills | Status: AC

## 2019-09-06 NOTE — ED Provider Notes (Signed)
HPI  SUBJECTIVE:  Jaime Ibarra is a 29 y.o. female who presents with "gunk under my tongue".  This returned 3 to 4 days ago.  She states it is tender, burns.  Does not itch.  She is not sure if it is on her cheeks, top of the tongue or on hard palate.  No fevers, body aches, tongue swelling, or swelling underneath the tongue.  She has tried pickles, apple cider vinegar without improvement in her symptoms. No Aggravating or alleviating factors.  No recent antibiotics.  She is not a smoker.  She is breast-feeding.  She states this is the exact same spot and identical to the previous episode of oral thrush that she was seen here for on 7/14. Patient was sent home with oral fluconazole 200 mg once followed by 100 mg daily for 7 days total and nystatin swish and swallow. States that she got mostly better with this treatment, but not completely so.  States that last time she had thrush on her nipples, she required 2 weeks of Diflucan.  No history of diabetes, HIV, immunocompromise.  LMP: 02/2018.  She denies the possibility of being pregnant.  She has Nexplanon.  PMD: None.  History reviewed. No pertinent past medical history.  Past Surgical History:  Procedure Laterality Date  . NO PAST SURGERIES      History reviewed. No pertinent family history.  Social History   Tobacco Use  . Smoking status: Former Smoker    Packs/day: 0.25    Types: Cigarettes  . Smokeless tobacco: Never Used  Vaping Use  . Vaping Use: Never used  Substance Use Topics  . Alcohol use: No  . Drug use: Never    No current facility-administered medications for this encounter.  Current Outpatient Medications:  .  albuterol (PROVENTIL HFA;VENTOLIN HFA) 108 (90 Base) MCG/ACT inhaler, Inhale 2 puffs into the lungs every 6 (six) hours as needed for wheezing or shortness of breath., Disp: 1 Inhaler, Rfl: 2 .  clotrimazole (MYCELEX) 10 MG troche, Take 1 tablet (10 mg total) by mouth 5 (five) times daily for 7 days., Disp: 35  tablet, Rfl: 0 .  fluconazole (DIFLUCAN) 200 MG tablet, Take 1 tablet (200 mg total) by mouth daily for 14 days., Disp: 14 tablet, Rfl: 0 .  fluticasone (FLONASE) 50 MCG/ACT nasal spray, Place 2 sprays into both nostrils daily., Disp: 16 g, Rfl: 0 .  Prenatal Vit-Fe Fumarate-FA (PRENATAL MULTIVITAMIN) TABS tablet, Take 1 tablet by mouth., Disp: , Rfl:   No Known Allergies   ROS  As noted in HPI.   Physical Exam  BP 109/64 (BP Location: Right Arm)   Pulse 93   Temp 98.3 F (36.8 C) (Oral)   Resp 18   Ht 5' (1.524 m)   Wt 59 kg   SpO2 98%   BMI 25.39 kg/m   Constitutional: Well developed, well nourished, no acute distress Eyes:  EOMI, conjunctiva normal bilaterally HENT: Normocephalic, atraumatic,mucus membranes moist tender white plaques under tongue consistent with thrush.  Gingiva, buccal mucosa, hard palate normal.  No swelling under the tongue. Neck: No cervical lymphadenopathy Respiratory: Normal inspiratory effort Cardiovascular: Normal rate GI: nondistended skin: No rash, skin intact Musculoskeletal: no deformities Neurologic: Alert & oriented x 3, no focal neuro deficits Psychiatric: Speech and behavior appropriate   ED Course   Medications - No data to display  No orders of the defined types were placed in this encounter.   No results found for this or any previous visit (  from the past 24 hour(s)). No results found.  ED Clinical Impression  1. Oral thrush      ED Assessment/Plan  Patient states that she mostly got better with the initial week of Diflucan and nystatin but it did not completely eradicate her symptoms.  Has a history of requiring 2 weeks of fluconazole treatment for eradication.  Will try fluconazole 200 mg bid x 2 weeks,  clotrimazole Troches 10 mg 5 times daily for a week.  Providing primary care list for ongoing care.  Discussed MDM, treatment plan, and plan for follow-up with patient.. patient agrees with plan.   Meds ordered this  encounter  Medications  . fluconazole (DIFLUCAN) 200 MG tablet    Sig: Take 1 tablet (200 mg total) by mouth daily for 14 days.    Dispense:  14 tablet    Refill:  0  . clotrimazole (MYCELEX) 10 MG troche    Sig: Take 1 tablet (10 mg total) by mouth 5 (five) times daily for 7 days.    Dispense:  35 tablet    Refill:  0    *This clinic note was created using Scientist, clinical (histocompatibility and immunogenetics). Therefore, there may be occasional mistakes despite careful proofreading.   ?    Domenick Gong, MD 09/06/19 1228

## 2019-09-06 NOTE — Discharge Instructions (Addendum)
I am giving you the fluconazole for 2 weeks at a higher dose.  We are going to try clotrimazole rather than the nystatin again.  Wash your toothbrush daily, follow-up with a primary care physician of your choice, see list below.  Here is a list of primary care providers who are taking new patients:  Dr. Elizabeth Sauer [address] [place] [address] [phone]  [facility] [address] [place], [place] [address] [phone]  [facility] [place] [address] [place] [address]  [phone]  [facility] [address] [place], [place] [address] [phone]  [facility] [address] [place]  [phone] [place], [place] [address]  Here are clinics/ other resources who will see you if you do not have insurance. Some have certain criteria that you must meet. Call them and find out what they are:  [facility]: [address], [place], [place] [address] Phone: [phone] Hours: First and Third Saturdays of each Month, 9 a.m. - 1 p.m.  [facility]: [address] [place], [place] [address] Phone: [phone] Hours: Tuesday, 4 p.m. - 8 p.m. Thursday, 1 p.m. - 8 p.m. Wednesday, 9 a.m. - [facility] [address], [place], [place] [address] Phone: [phone] Pharmacy Phone Number: [phone] Dental Phone Number: [phone] [facility] Insurance Help: [phone]  Dental Hours: Monday - Thursday, 8 a.m. - 6 p.m.  [facility] [address], [place], [place] [address] Phone: [phone] Pharmacy Phone Number: [phone] [facility] Insurance Help: [phone]  [facility] [address] [place], [place] [address] Phone: [phone] Pharmacy Phone Number: [phone] [facility] Insurance Help: [phone]  [facility] [address] [place], [place] [address] Phone: [phone] [facility] Insurance Help: [phone]    [facility] [address] [place], [place] [address] Phone: [phone]  Go to www.goodrx.com to look up your medications. This will give you a list of where you can find your prescriptions at the most affordable prices. Or ask the pharmacist what the cash price is, or if they have any other discount programs available to help make your medication more affordable. This can be less expensive than what you would pay with insurance.

## 2019-09-06 NOTE — ED Triage Notes (Signed)
Patient c/o thrush that she has had for about 1 month. She came here and was given medication and took all of this and it resolved somewhat but has returned.

## 2020-03-07 ENCOUNTER — Ambulatory Visit
Admission: EM | Admit: 2020-03-07 | Discharge: 2020-03-07 | Disposition: A | Discharge status: Home / Self Care | Payer: Medicaid Other

## 2020-03-07 ENCOUNTER — Other Ambulatory Visit: Payer: Self-pay

## 2020-03-07 ENCOUNTER — Encounter: Payer: Self-pay | Admitting: Emergency Medicine

## 2020-03-07 DIAGNOSIS — H6983 Other specified disorders of Eustachian tube, bilateral: Secondary | ICD-10-CM

## 2020-03-07 DIAGNOSIS — H66003 Acute suppurative otitis media without spontaneous rupture of ear drum, bilateral: Secondary | ICD-10-CM

## 2020-03-07 MED ORDER — AMOXICILLIN-POT CLAVULANATE 875-125 MG PO TABS: 1.0000 | ORAL_TABLET | Freq: Two times a day (BID) | ORAL | 0 refills | Status: AC

## 2020-03-07 NOTE — ED Provider Notes (Signed)
[facility]    CSN: [id number] Arrival date & time: 03/07/20  1536      History   Chief Complaint Chief Complaint  Patient presents with  . Ear Pain    HPI Jaime Ibarra is a 30 y.o. female.   HPI   30 year old female here for evaluation of bilateral ear pain, ringing in her ears, muffled hearing, and intermittent dizziness that been going on for 2 weeks.  Patient denies fever, runny nose, sore throat, cough, shortness of breath, or recent travel.  History reviewed. No pertinent past medical history.  Patient Active Problem List   Diagnosis Date Noted  . Indication for care in labor or delivery 08/28/2018    Past Surgical History:  Procedure Laterality Date  . NO PAST SURGERIES      OB History    Gravida  1   Para      Term      Preterm      AB      Living        SAB      IAB      Ectopic      Multiple      Live Births               Home Medications    Prior to Admission medications   Medication Sig Start Date End Date Taking? Authorizing Provider  amoxicillin-clavulanate (AUGMENTIN) 875-125 MG tablet Take 1 tablet by mouth every 12 (twelve) hours for 10 days. 03/07/20 03/17/20 Yes Margarette Canada, NP  sertraline (ZOLOFT) 50 MG tablet Take by mouth. 02/09/20 04/09/20 Yes [provider]  fluticasone (FLONASE) 50 MCG/ACT nasal spray Place 2 sprays into both nostrils daily. 11/27/16 11/27/17  Johnn Hai, PA-C  albuterol (PROVENTIL HFA;VENTOLIN HFA) 108 (90 Base) MCG/ACT inhaler Inhale 2 puffs into the lungs every 6 (six) hours as needed for wheezing or shortness of breath. 04/13/17 03/07/20  Lannie Fields, PA-C  Cetirizine HCl (ZYRTEC ALLERGY) 10 MG CAPS Take 1 capsule (10 mg total) by mouth daily. Patient not taking: Reported on 08/28/2018 04/13/17 09/14/18  Lannie Fields, PA-C    Family History History reviewed. No pertinent family history.  Social History Social History   Tobacco Use  . Smoking status: Former Smoker     Packs/day: 0.25    Types: Cigarettes  . Smokeless tobacco: Never Used  Vaping Use  . Vaping Use: Never used  Substance Use Topics  . Alcohol use: No  . Drug use: Never     Allergies   Patient has no known allergies.   Review of Systems Review of Systems  Constitutional: Negative for activity change, appetite change and fever.  HENT: Positive for ear pain, hearing loss and tinnitus. Negative for ear discharge, rhinorrhea and sore throat.   Respiratory: Negative for cough, shortness of breath and wheezing.   Neurological: Positive for dizziness.  Hematological: Negative.   Psychiatric/Behavioral: Negative.      Physical Exam Triage Vital Signs ED Triage Vitals [03/07/20 1611]  Enc Vitals Group     BP      Pulse      Resp      Temp      Temp src      SpO2      Weight 135 lb (61.2 kg)     Height 5' (1.524 m)     Head Circumference      Peak Flow      Pain Score 2  Pain Loc      Pain Edu?      Excl. in GC?    No data found.  Updated Vital Signs BP 112/66 (BP Location: Right Arm)   Pulse 70   Temp 98.3 F (36.8 C) (Oral)   Resp 18   Ht 5' (1.524 m)   Wt 135 lb (61.2 kg)   SpO2 100%   BMI 26.37 kg/m   Visual Acuity Right Eye Distance:   Left Eye Distance:   Bilateral Distance:    Right Eye Near:   Left Eye Near:    Bilateral Near:     Physical Exam Vitals and nursing note reviewed.  Constitutional:      General: She is not in acute distress.    Appearance: Normal appearance. She is not ill-appearing.  HENT:     Head: Normocephalic and atraumatic.     Right Ear: Ear canal and external ear normal.     Left Ear: Ear canal and external ear normal.  Cardiovascular:     Rate and Rhythm: Normal rate and regular rhythm.     Pulses: Normal pulses.     Heart sounds: Normal heart sounds. No murmur heard. No gallop.   Pulmonary:     Effort: Pulmonary effort is normal.     Breath sounds: Normal breath sounds. No wheezing, rhonchi or rales.   Skin:    General: Skin is warm and dry.     Capillary Refill: Capillary refill takes less than 2 seconds.     Findings: No rash.  Neurological:     General: No focal deficit present.     Mental Status: She is alert and oriented to person, place, and time.  Psychiatric:        Mood and Affect: Mood normal.        Behavior: Behavior normal.        Thought Content: Thought content normal.        Judgment: Judgment normal.      UC Treatments / Results  Labs (all labs ordered are listed, but only abnormal results are displayed) Labs Reviewed - No data to display  EKG   Radiology No results found.  Procedures Procedures (including critical care time)  Medications Ordered in UC Medications - No data to display  Initial Impression / Assessment and Plan / UC Course  I have reviewed the triage vital signs and the nursing notes.  Pertinent labs & imaging results that were available during my care of the patient were reviewed by me and considered in my medical decision making (see chart for details).   Is here for bilateral ear pain evaluation that is been going on for the past 2 weeks.  She has had associated symptoms of ringing in her ears, muffled hearing, and intermittent dizziness.  Patient is not in any acute distress at present.  Physical exam reveals an erythematous left TM with a serous effusion, and a small serous effusion without erythema on the right.  Patient does have tenderness when palpating the eustachian tube externally bilaterally.  We will treat patient for otitis media with Augmentin twice daily for 10 days due to protracted timeframe of her symptoms and sinus irrigation to help alleviate the eustachian tube dysfunction.   Final Clinical Impressions(s) / UC Diagnoses   Final diagnoses:  Non-recurrent acute suppurative otitis media of both ears without spontaneous rupture of tympanic membranes  Eustachian tube dysfunction, bilateral     Discharge  Instructions     Take   the Augmentin twice daily for 10 days with food for treatment of your ear infection.  Perform sinus irrigation twice daily with a [employer] sinus rinse kit and distilled water.  Use over-the-counter Tylenol and ibuprofen as needed for pain or fever.  Take an over-the-counter probiotic 1 hour after each dose of antibiotic to prevent diarrhea from occurring.  Return for reevaluation or see your primary care provider if your symptoms do not improve or resolve.    ED Prescriptions    Medication Sig Dispense Auth. Provider   amoxicillin-clavulanate (AUGMENTIN) 875-125 MG tablet Take 1 tablet by mouth every 12 (twelve) hours for 10 days. 20 tablet Ryan, Jeremy, NP     PDMP not reviewed this encounter.   Ryan, Jeremy, NP 03/07/20 1635  

## 2020-03-07 NOTE — Discharge Instructions (Addendum)
Take the Augmentin twice daily for 10 days with food for treatment of your ear infection.  Perform sinus irrigation twice daily with a [employer]Med sinus rinse kit and distilled water.  Use over-the-counter Tylenol and ibuprofen as needed for pain or fever.  Take an over-the-counter probiotic 1 hour after each dose of antibiotic to prevent diarrhea from occurring.  Return for reevaluation or see your primary care provider if your symptoms do not improve or resolve.

## 2020-03-07 NOTE — ED Triage Notes (Signed)
Patient c/o bilateral ear pain and pressure that started 2 weeks ago.

## 2021-01-19 ENCOUNTER — Other Ambulatory Visit: Payer: Self-pay

## 2021-01-19 ENCOUNTER — Ambulatory Visit
Admission: EM | Admit: 2021-01-19 | Discharge: 2021-01-19 | Disposition: A | Discharge status: Home / Self Care | Payer: Medicaid Other

## 2021-01-19 ENCOUNTER — Ambulatory Visit (INDEPENDENT_AMBULATORY_CARE_PROVIDER_SITE_OTHER): Payer: Medicaid Other

## 2021-01-19 DIAGNOSIS — J209 Acute bronchitis, unspecified: Secondary | ICD-10-CM

## 2021-01-19 DIAGNOSIS — R0782 Intercostal pain: Secondary | ICD-10-CM | POA: Diagnosis not present

## 2021-01-19 HISTORY — DX: Epilepsy, unspecified, not intractable, without status epilepticus: G40.909

## 2021-01-19 MED ORDER — BENZONATATE 100 MG PO CAPS: 200.0000 mg | ORAL_CAPSULE | Freq: Three times a day (TID) | ORAL | 0 refills | Status: AC

## 2021-01-19 MED ORDER — PREDNISONE 20 MG PO TABS: 60.0000 mg | ORAL_TABLET | Freq: Every day | ORAL | 0 refills | Status: AC

## 2021-01-19 MED ORDER — ALBUTEROL SULFATE HFA 108 (90 BASE) MCG/ACT IN AERS: 2.0000 | INHALATION_SPRAY | RESPIRATORY_TRACT | 0 refills | Status: AC | PRN

## 2021-01-19 MED ORDER — PROMETHAZINE-DM 6.25-15 MG/5ML PO SYRP: 5.0000 mL | ORAL_SOLUTION | Freq: Four times a day (QID) | ORAL | 0 refills | Status: AC | PRN

## 2021-01-19 NOTE — ED Provider Notes (Signed)
[facility]    CSN: [id number] Arrival date & time: 01/19/21  1529      History   Chief Complaint Chief Complaint  Patient presents with   Rib Pain (Non Chest)    HPI Jaime Ibarra is a 30 y.o. female.   HPI  30 year old female here for evaluation of left rib pain.  Patient reports that she has been experiencing pain in her posterior left ribs for the past 2 weeks.  This not been associated with any known injury.  She was diagnosed and treated for bronchitis back in November and reports that she is still continuing to have a cough that is worse at night and is productive for a thick white to yellow sputum.  This is causing some shortness of breath but no wheezing.  She does endorse some mild nasal congestion and left ear pain as well.  She denies runny nose or sore throat.  Past Medical History:  Diagnosis Date   Epilepsy [facility])     Patient Active Problem List   Diagnosis Date Noted   Indication for care in labor or delivery 08/28/2018    Past Surgical History:  Procedure Laterality Date   NO PAST SURGERIES      OB History     Gravida  1   Para      Term      Preterm      AB      Living         SAB      IAB      Ectopic      Multiple      Live Births               Home Medications    Prior to Admission medications   Medication Sig Start Date End Date Taking? Authorizing Provider  albuterol (VENTOLIN HFA) 108 (90 Base) MCG/ACT inhaler Inhale 2 puffs into the lungs every 4 (four) hours as needed. 01/19/21  Yes Becky Augusta, NP  B Complex Vitamins (VITAMIN B COMPLEX PO) Take by mouth.   Yes [provider]  benzonatate (TESSALON) 100 MG capsule Take 2 capsules (200 mg total) by mouth every 8 (eight) hours. 01/19/21  Yes Becky Augusta, NP  folic acid (FOLVITE) 1 MG tablet Take 1 mg by mouth 2 (two) times daily. 01/19/21  Yes [provider]  levETIRAcetam (KEPPRA) 500 MG tablet Take by mouth. 12/25/20 12/25/21  Yes [provider]  predniSONE (DELTASONE) 20 MG tablet Take 3 tablets (60 mg total) by mouth daily with breakfast for 5 days. 3 tablets daily for 5 days. 01/19/21 01/24/21 Yes Becky Augusta, NP  promethazine-dextromethorphan (PROMETHAZINE-DM) 6.25-15 MG/5ML syrup Take 5 mLs by mouth 4 (four) times daily as needed. 01/19/21  Yes Becky Augusta, NP  fluticasone (FLONASE) 50 MCG/ACT nasal spray Place 2 sprays into both nostrils daily. 11/27/16 11/27/17  Tommi Rumps, PA-C  sertraline (ZOLOFT) 50 MG tablet Take by mouth. 02/09/20 04/09/20  [provider]  Cetirizine HCl (ZYRTEC ALLERGY) 10 MG CAPS Take 1 capsule (10 mg total) by mouth daily. Patient not taking: Reported on 08/28/2018 04/13/17 09/14/18  Orvil Feil, PA-C    Family History History reviewed. No pertinent family history.  Social History Social History   Tobacco Use   Smoking status: Former    Packs/day: 0.25    Types: Cigarettes   Smokeless tobacco: Never  Vaping Use   Vaping Use: Never used  Substance Use Topics   Alcohol  use: No   Drug use: Never     Allergies   Cephalexin   Review of Systems Review of Systems  Constitutional:  Negative for activity change, appetite change and fever.  HENT:  Positive for congestion and ear pain. Negative for rhinorrhea and sore throat.   Respiratory:  Positive for cough and shortness of breath. Negative for wheezing.   Cardiovascular:  Positive for chest pain.  Hematological: Negative.   Psychiatric/Behavioral: Negative.      Physical Exam Triage Vital Signs ED Triage Vitals  Enc Vitals Group     BP 01/19/21 1552 115/62     Pulse Rate 01/19/21 1552 89     Resp 01/19/21 1552 18     Temp 01/19/21 1552 98.6 F (37 C)     Temp Source 01/19/21 1552 Oral     SpO2 01/19/21 1552 100 %     Weight 01/19/21 1548 120 lb (54.4 kg)     Height --      Head Circumference --      Peak Flow --      Pain Score 01/19/21 1547 10     Pain Loc --      Pain Edu? --       Excl. in GC? --    No data found.  Updated Vital Signs BP 115/62 (BP Location: Left Arm)    Pulse 89    Temp 98.6 F (37 C) (Oral)    Resp 18    Wt 120 lb (54.4 kg)    SpO2 100%    BMI 23.44 kg/m   Visual Acuity Right Eye Distance:   Left Eye Distance:   Bilateral Distance:    Right Eye Near:   Left Eye Near:    Bilateral Near:     Physical Exam Vitals and nursing note reviewed.  Constitutional:      General: She is not in acute distress.    Appearance: Normal appearance. She is not ill-appearing.  HENT:     Head: Normocephalic and atraumatic.     Right Ear: Tympanic membrane, ear canal and external ear normal. There is no impacted cerumen.     Left Ear: Tympanic membrane, ear canal and external ear normal. There is no impacted cerumen.     Nose: Congestion present. No rhinorrhea.     Mouth/Throat:     Mouth: Mucous membranes are moist.     Pharynx: Oropharynx is clear. No posterior oropharyngeal erythema.  Cardiovascular:     Rate and Rhythm: Normal rate and regular rhythm.     Pulses: Normal pulses.     Heart sounds: Normal heart sounds. No murmur heard.   No gallop.  Pulmonary:     Effort: Pulmonary effort is normal.     Breath sounds: Wheezing and rhonchi present. No rales.  Musculoskeletal:     Cervical back: Normal range of motion and neck supple.  Lymphadenopathy:     Cervical: No cervical adenopathy.  Skin:    General: Skin is warm and dry.     Capillary Refill: Capillary refill takes less than 2 seconds.     Findings: No erythema or rash.  Neurological:     General: No focal deficit present.     Mental Status: She is alert and oriented to person, place, and time.  Psychiatric:        Mood and Affect: Mood normal.        Behavior: Behavior normal.        Thought Content: Thought  content normal.        Judgment: Judgment normal.     UC Treatments / Results  Labs (all labs ordered are listed, but only abnormal results are displayed) Labs  Reviewed - No data to display  EKG   Radiology DG Ribs Unilateral W/Chest Left  Result Date: 01/19/2021 CLINICAL DATA:  Rib pain EXAM: LEFT RIBS AND CHEST - 3+ VIEW COMPARISON:  Chest radiograph 04/13/2017 FINDINGS: No fracture or other bone lesions are seen involving the ribs. There is no evidence of pneumothorax or pleural effusion. Both lungs are clear. Heart size and mediastinal contours are within normal limits. IMPRESSION: Negative. Electronically Signed   By: Emmaline Kluver M.D.   On: 01/19/2021 16:24    Procedures Procedures (including critical care time)  Medications Ordered in UC Medications - No data to display  Initial Impression / Assessment and Plan / UC Course  I have reviewed the triage vital signs and the nursing notes.  Pertinent labs & imaging results that were available during my care of the patient were reviewed by me and considered in my medical decision making (see chart for details).  Patient is a nontoxic-appearing 30 year old female here for evaluation of left-sided rib pain has been present for the past 2 weeks.  She also has lingering cough and shortness of breath since being diagnosed with bronchitis in November.  The cough is productive for a white to yellow sputum and is worse at night.  No wheezing.  No fever.  On exam patient has pearly-gray tympanic membranes bilaterally with normal light reflex and clear external auditory canals.  Nasal mucosa is edematous and erythematous without any discharge noted.  Oropharyngeal exam is benign.  No cervical lymphadenopathy appreciated exam.  Cardiopulmonary exam reveals faint wheezes in rhonchi and bilateral upper lung fields.  The bases are clear.  Chest x-ray and rib films were collected at triage and are negative for fracture, pneumothorax, or pneumonia.  Patient exam is consistent with continued bronchitis.  Without any evidence of pneumonia patient being afebrile I will treat her symptomatically with an albuterol  inhaler, give her a short course of prednisone to start tomorrow to help with pulmonary inflammation, Tessalon Perles, and Promethazine DM cough syrup.  Patient vies to return for new or worsening symptoms to include fever or rusty sputum production.   Final Clinical Impressions(s) / UC Diagnoses   Final diagnoses:  Acute bronchitis, unspecified organism     Discharge Instructions      Use the albuterol inhaler/nebulizer every 4-6 hours as needed for shortness of breath, wheezing, and cough.  Take the prednisone according to the package instructions to help with pulmonary inflammation.  Use the Tessalon Perles every 8 hours for your cough.  Taken with a small sip of water.  They may give you some numbness to the base of your tongue or metallic taste in her mouth, this is normal.  They are designed to calm down the cough reflex.  Use the Promethazine DM cough syrup at bedtime as will make you drowsy.  You may take 1 teaspoon (5 mL) every 6 hours.  Return for reevaluation for new or worsening symptoms.      ED Prescriptions     Medication Sig Dispense Auth. Provider   predniSONE (DELTASONE) 20 MG tablet Take 3 tablets (60 mg total) by mouth daily with breakfast for 5 days. 3 tablets daily for 5 days. 15 tablet Becky Augusta, NP   benzonatate (TESSALON) 100 MG capsule Take 2 capsules (200  mg total) by mouth every 8 (eight) hours. 21 capsule Becky Augusta, NP   promethazine-dextromethorphan (PROMETHAZINE-DM) 6.25-15 MG/5ML syrup Take 5 mLs by mouth 4 (four) times daily as needed. 118 mL Becky Augusta, NP   albuterol (VENTOLIN HFA) 108 (90 Base) MCG/ACT inhaler Inhale 2 puffs into the lungs every 4 (four) hours as needed. 18 g Becky Augusta, NP      I have reviewed the PDMP during this encounter.   Becky Augusta, NP 01/19/21 [phone]

## 2021-01-19 NOTE — Discharge Instructions (Signed)
Use the albuterol inhaler/nebulizer every 4-6 hours as needed for shortness of breath, wheezing, and cough.  Take the prednisone according to the package instructions to help with pulmonary inflammation.  Use the Tessalon Perles every 8 hours for your cough.  Taken with a small sip of water.  They may give you some numbness to the base of your tongue or metallic taste in her mouth, this is normal.  They are designed to calm down the cough reflex.  Use the Promethazine DM cough syrup at bedtime as will make you drowsy.  You may take 1 teaspoon (5 mL) every 6 hours.  Return for reevaluation for new or worsening symptoms.  

## 2021-01-19 NOTE — ED Triage Notes (Signed)
Patient is here for "Rib Pain" ."Last couple of weeks has been rough, recent diagnosis of Epilepsy, recent seizures, right after most recent seizures had Bronchitis, Cough still remains". This pain started about a week after starting Keppra, Been more than 2 wks now. Pain hurts "all the time". Pain is "stabbing, pressure". Pain is causing SOB.

## 2021-04-29 ENCOUNTER — Other Ambulatory Visit: Payer: Self-pay

## 2021-04-29 ENCOUNTER — Ambulatory Visit
Admission: EM | Admit: 2021-04-29 | Discharge: 2021-04-29 | Disposition: A | Discharge status: Home / Self Care | Payer: Medicaid Other | Attending: Physician Assistant | Admitting: Physician Assistant

## 2021-04-29 ENCOUNTER — Encounter: Payer: Self-pay | Admitting: Emergency Medicine

## 2021-04-29 DIAGNOSIS — J029 Acute pharyngitis, unspecified: Secondary | ICD-10-CM

## 2021-04-29 DIAGNOSIS — H9203 Otalgia, bilateral: Secondary | ICD-10-CM

## 2021-04-29 LAB — GROUP A STREP BY PCR: Group A Strep by PCR: NOT DETECTED

## 2021-04-29 NOTE — ED Provider Notes (Signed)
?[facility] ? ? ? ?CSN: [id number] ?Arrival date & time: 04/29/21  0093 ? ? ?  ? ?History   ?Chief Complaint ?Chief Complaint  ?Patient presents with  ? Otalgia  ? ? ?HPI ?Jaime Ibarra is a 31 y.o. female presenting for approximately 3-week history of bilateral ear pain.  Reports pain is constant and mild but has occasional severe pains.  Patient reports over the past week or 2 she has developed a sore throat and painful swallowing as well.  Denies any change in hearing or drainage from ears.  No fever, cough.  Has had some nasal congestion.  Reports that she takes hydroxyzine and has been using nasal saline.  History of epilepsy.  She is concerned about the symptoms being a potential side effect of the Keppra for epilepsy that she recently started about 3 to 4 weeks ago. ? ?HPI ? ?Past Medical History:  ?Diagnosis Date  ? Epilepsy (HCC)   ? ? ?Patient Active Problem List  ? Diagnosis Date Noted  ? Indication for care in labor or delivery 08/28/2018  ? ? ?Past Surgical History:  ?Procedure Laterality Date  ? NO PAST SURGERIES    ? ? ?OB History   ? ? Gravida  ?1  ? Para  ?   ? Term  ?   ? Preterm  ?   ? AB  ?   ? Living  ?   ?  ? ? SAB  ?   ? IAB  ?   ? Ectopic  ?   ? Multiple  ?   ? Live Births  ?   ?   ?  ?  ? ? ? ?Home Medications   ? ?Prior to Admission medications   ?Medication Sig Start Date End Date Taking? Authorizing Provider  ?albuterol (VENTOLIN HFA) 108 (90 Base) MCG/ACT inhaler Inhale 2 puffs into the lungs every 4 (four) hours as needed. 01/19/21   Becky Augusta, NP  ?B Complex Vitamins (VITAMIN B COMPLEX PO) Take by mouth.    [provider]  ?benzonatate (TESSALON) 100 MG capsule Take 2 capsules (200 mg total) by mouth every 8 (eight) hours. 01/19/21   Becky Augusta, NP  ?fluticasone (FLONASE) 50 MCG/ACT nasal spray Place 2 sprays into both nostrils daily. 11/27/16 11/27/17  Tommi Rumps, PA-C  ?folic acid (FOLVITE) 1 MG tablet Take 1 mg by mouth 2 (two) times daily. 01/19/21    [provider]  ?levETIRAcetam (KEPPRA) 500 MG tablet Take by mouth. 12/25/20 12/25/21  [provider]  ?promethazine-dextromethorphan (PROMETHAZINE-DM) 6.25-15 MG/5ML syrup Take 5 mLs by mouth 4 (four) times daily as needed. 01/19/21   Becky Augusta, NP  ?sertraline (ZOLOFT) 50 MG tablet Take by mouth. 02/09/20 04/09/20  [provider]  ?Cetirizine HCl (ZYRTEC ALLERGY) 10 MG CAPS Take 1 capsule (10 mg total) by mouth daily. ?Patient not taking: Reported on 08/28/2018 04/13/17 09/14/18  Orvil Feil, PA-C  ? ? ?Family History ?History reviewed. No pertinent family history. ? ?Social History ?Social History  ? ?Tobacco Use  ? Smoking status: Former  ?  Packs/day: 0.25  ?  Types: Cigarettes  ? Smokeless tobacco: Never  ?Vaping Use  ? Vaping Use: Never used  ?Substance Use Topics  ? Alcohol use: No  ? Drug use: Never  ? ? ? ?Allergies   ?Cephalexin, Lacosamide, and Lamotrigine ? ? ?Review of Systems ?Review of Systems  ?Constitutional:  Negative for chills, diaphoresis, fatigue and fever.  ?HENT:  Positive  for congestion, ear pain and sore throat. Negative for ear discharge, hearing loss, rhinorrhea, sinus pressure and sinus pain.   ?Respiratory:  Negative for cough and shortness of breath.   ?Gastrointestinal:  Negative for abdominal pain, nausea and vomiting.  ?Musculoskeletal:  Negative for arthralgias and myalgias.  ?Skin:  Negative for rash.  ?Neurological:  Negative for weakness and headaches.  ?Hematological:  Negative for adenopathy.  ? ? ?Physical Exam ?Triage Vital Signs ?ED Triage Vitals  ?Enc Vitals Group  ?   BP 04/29/21 0917 105/63  ?   Pulse Rate 04/29/21 0917 81  ?   Resp 04/29/21 0917 17  ?   Temp 04/29/21 0917 98.3 ?F (36.8 ?C)  ?   Temp Source 04/29/21 0917 Oral  ?   SpO2 04/29/21 0917 100 %  ?   Weight 04/29/21 0915 119 lb 14.9 oz (54.4 kg)  ?   Height 04/29/21 0915 5' (1.524 m)  ?   Head Circumference --   ?   Peak Flow --   ?   Pain Score 04/29/21 0914 1  ?   Pain Loc --    ?   Pain Edu? --   ?   Excl. in GC? --   ? ?No data found. ? ?Updated Vital Signs ?BP 105/63 (BP Location: Left Arm)   Pulse 81   Temp 98.3 ?F (36.8 ?C) (Oral)   Resp 17   Ht 5' (1.524 m)   Wt 119 lb 14.9 oz (54.4 kg)   SpO2 100%   BMI 23.42 kg/m?  ?   ? ?Physical Exam ?Vitals and nursing note reviewed.  ?Constitutional:   ?   General: She is not in acute distress. ?   Appearance: Normal appearance. She is not ill-appearing or toxic-appearing.  ?HENT:  ?   Head: Normocephalic and atraumatic.  ?   Right Ear: Hearing, tympanic membrane, ear canal and external ear normal.  ?   Left Ear: Hearing, ear canal and external ear normal. A middle ear effusion is present.  ?   Nose: Nose normal.  ?   Mouth/Throat:  ?   Mouth: Mucous membranes are moist.  ?   Pharynx: Oropharynx is clear. Posterior oropharyngeal erythema present.  ?Eyes:  ?   General: No scleral icterus.    ?   Right eye: No discharge.     ?   Left eye: No discharge.  ?   Conjunctiva/sclera: Conjunctivae normal.  ?Cardiovascular:  ?   Rate and Rhythm: Normal rate and regular rhythm.  ?   Heart sounds: Normal heart sounds.  ?Pulmonary:  ?   Effort: Pulmonary effort is normal. No respiratory distress.  ?   Breath sounds: Normal breath sounds.  ?Musculoskeletal:  ?   Cervical back: Neck supple.  ?Lymphadenopathy:  ?   Cervical: Cervical adenopathy present.  ?Skin: ?   General: Skin is dry.  ?Neurological:  ?   General: No focal deficit present.  ?   Mental Status: She is alert. Mental status is at baseline.  ?   Motor: No weakness.  ?   Gait: Gait normal.  ?Psychiatric:     ?   Mood and Affect: Mood normal.     ?   Behavior: Behavior normal.     ?   Thought Content: Thought content normal.  ? ? ? ?UC Treatments / Results  ?Labs ?(all labs ordered are listed, but only abnormal results are displayed) ?Labs Reviewed  ?GROUP A STREP BY PCR  ? ? ?  EKG ? ? ?Radiology ?No results found. ? ?Procedures ?Procedures (including critical care time) ? ?Medications Ordered  in UC ?Medications - No data to display ? ?Initial Impression / Assessment and Plan / UC Course  ?I have reviewed the triage vital signs and the nursing notes. ? ?Pertinent labs & imaging results that were available during my care of the patient were reviewed by me and considered in my medical decision making (see chart for details). ? ?32 year old female presenting for bilateral ear pain and sore throat.  Original symptom onset was about 3 weeks ago.  Has had some congestion.  No fever.  Vitals normal and stable patient is overall well-appearing.  On exam she has mild effusion of left TM.  No evidence of erythema or bulging of TMs or swelling, erythema or drainage in ear canals.  Also, no cerumen.  She does have erythema of the posterior pharynx with mildly tender and enlarged anterior cervical lymph nodes bilaterally. ? ?PCR strep test obtained.  Negative.  Discussed results with patient. ? ?Advised patient symptoms consistent for viral illness versus allergies.  Given that its been going on for 3 weeks, suspect more likely allergies.  Advise continuing the hydroxyzine and starting Flonase and nasal saline.  Increase rest and fluids.  Reviewed following up with PCP if not improving.  If she still has concerns about the Keppra, follow-up with neurologist.  I feel it is unlikely that her symptoms are related to side effect from the Keppra but if she is concerned, she should reach out to the neurologist. ? ? ?Final Clinical Impressions(s) / UC Diagnoses  ? ?Final diagnoses:  ?Acute ear pain, bilateral  ?Acute pharyngitis, unspecified etiology  ? ? ? ?Discharge Instructions   ? ?  ?-We will call with the result of your strep test. ?- There is a small note of fluid behind the left tympanic membrane.  I would advise using Flonase and containing antihistamines and nasal spray. ?- If you have strep we will send antibiotics. ? ? ? ? ?ED Prescriptions   ?None ?  ? ?PDMP not reviewed this encounter. ?  ?Shirlee Latch,  PA-C ?04/29/21 1134 ? ?

## 2021-04-29 NOTE — ED Triage Notes (Signed)
Pt reports bilateral ear pain x 3 weeks.  ?

## 2021-04-29 NOTE — Discharge Instructions (Signed)
-  We will call with the result of your strep test. ?- There is a small note of fluid behind the left tympanic membrane.  I would advise using Flonase and containing antihistamines and nasal spray. ?- If you have strep we will send antibiotics. ?

## 2023-08-28 IMAGING — CR DG RIBS W/ CHEST 3+V*L*
5 series · 5 of 5 positions shown · non-contrast
Comparison: Chest radiograph 04/13/2017

CLINICAL DATA: Rib pain

EXAM:
LEFT RIBS AND CHEST - 3+ VIEW

[chest pa]
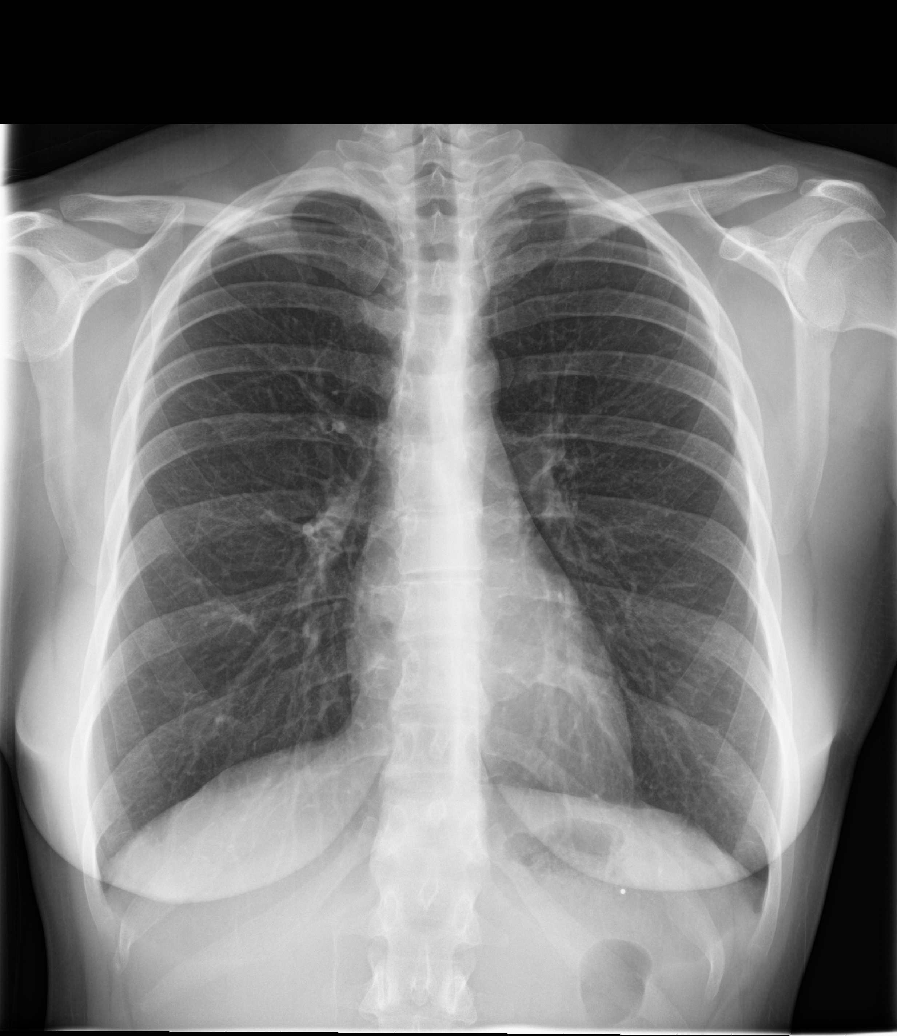

[rib pa (1 of 2)]
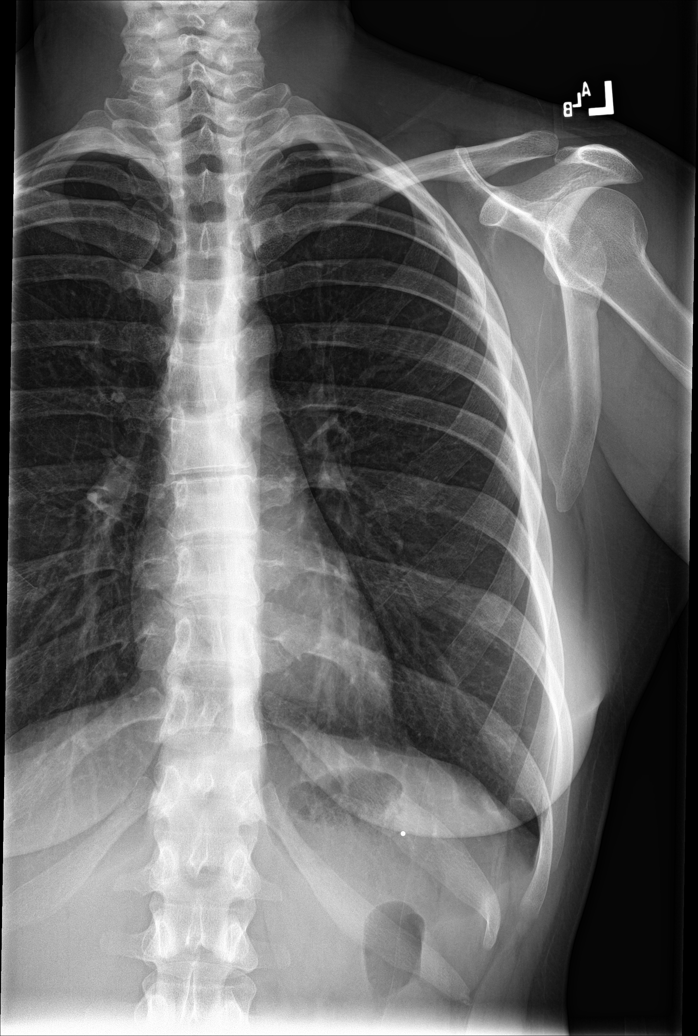

[rib pa (2 of 2)]
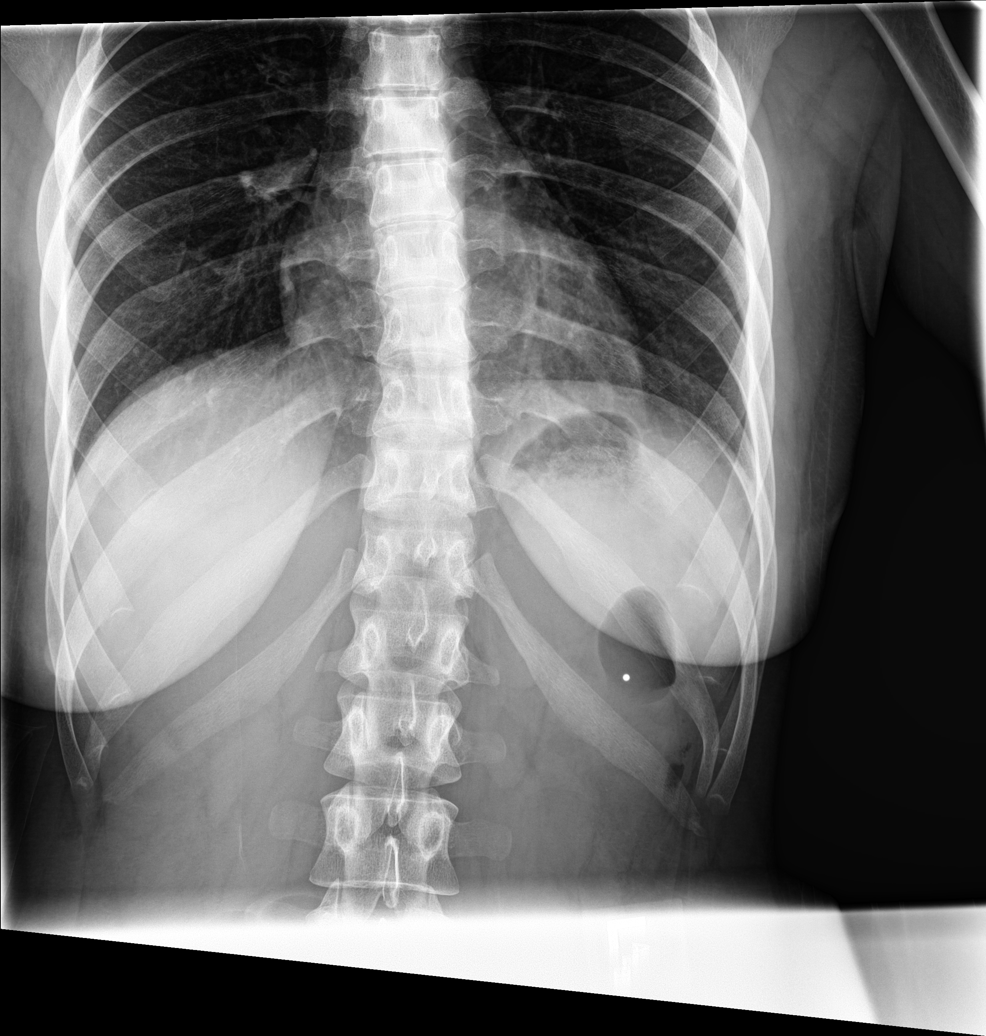

[rib obl (1 of 2)]
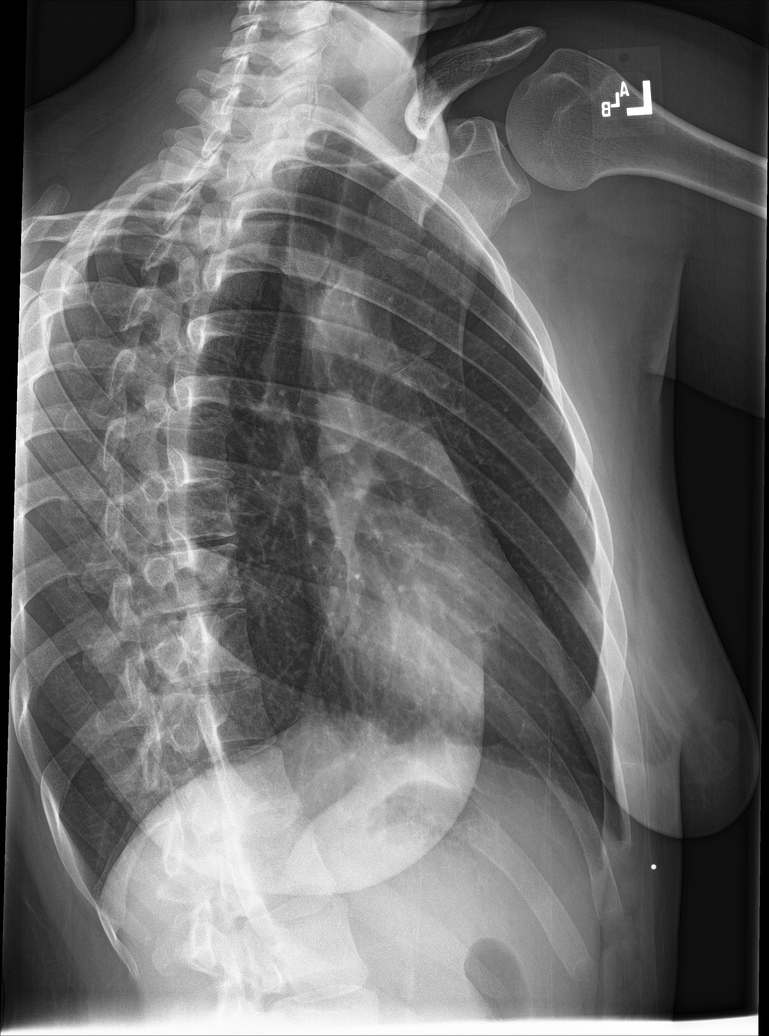

[rib obl (2 of 2)]
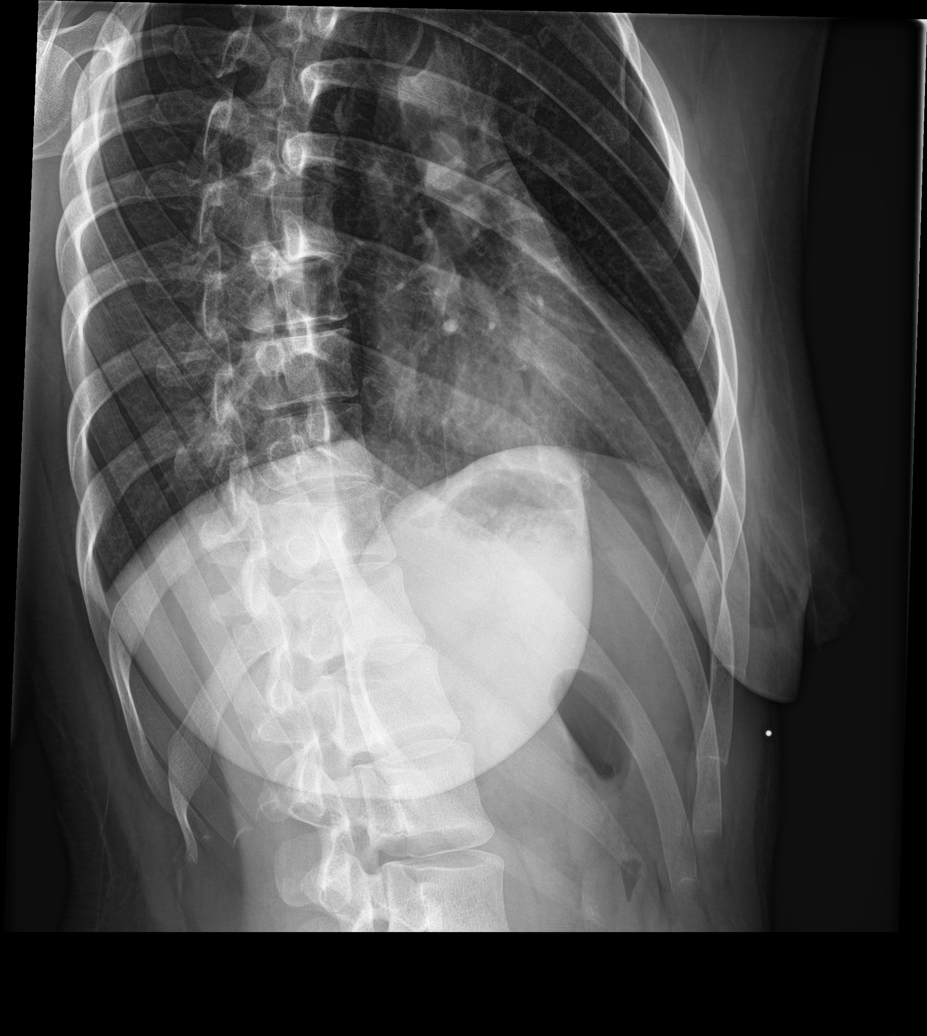

[5 of 5 positions shown; findings below may reference images not displayed]

FINDINGS: No fracture or other bone lesions are seen involving the ribs. There
is no evidence of pneumothorax or pleural effusion. Both lungs are
clear. Heart size and mediastinal contours are within normal limits.
IMPRESSION: Negative.
